# Patient Record
Sex: Male | Born: 1995 | Race: White | Hispanic: No | Marital: Single | Smoking: Never smoker
Health system: Southern US, Community
[De-identification: ages and names within clinical notes are randomized; demographics above are authoritative.]

## PROBLEM LIST (undated history)

## (undated) DIAGNOSIS — T7840XA Allergy, unspecified, initial encounter: Secondary | ICD-10-CM

## (undated) DIAGNOSIS — F419 Anxiety disorder, unspecified: Secondary | ICD-10-CM

## (undated) HISTORY — DX: Allergy, unspecified, initial encounter: T78.40XA

## (undated) HISTORY — PX: OTHER SURGICAL HISTORY: SHX169

## (undated) HISTORY — DX: Anxiety disorder, unspecified: F41.9

---

## 2015-09-05 ENCOUNTER — Ambulatory Visit: Payer: Self-pay | Admitting: Family Medicine

## 2015-09-06 ENCOUNTER — Encounter: Payer: Self-pay | Admitting: Family Medicine

## 2015-09-06 ENCOUNTER — Ambulatory Visit (INDEPENDENT_AMBULATORY_CARE_PROVIDER_SITE_OTHER): Payer: 59 | Admitting: Family Medicine

## 2015-09-06 VITALS — BP 124/78 | HR 80 | Temp 97.6°F | Resp 16 | Ht 72.0 in | Wt 167.0 lb

## 2015-09-06 DIAGNOSIS — Z23 Encounter for immunization: Secondary | ICD-10-CM | POA: Diagnosis not present

## 2015-09-06 DIAGNOSIS — N433 Hydrocele, unspecified: Secondary | ICD-10-CM

## 2015-09-06 DIAGNOSIS — F418 Other specified anxiety disorders: Secondary | ICD-10-CM

## 2015-09-06 NOTE — Progress Notes (Signed)
   Subjective:    Patient ID: Steven Guerra, male    DOB: [birth year], 19 y.o.   MRN: [id number]  HPI: Steven Guerra is a 19 y.o. male presenting on 09/06/2015 for Cyst   HPI  Pt presents for lump in R scrotum. Noticed on Saturday. Unsure if it was sore- thought it was sore but was anxious about it. Knot has gotten smaller since that day. No urinary symptoms. No pain currently. No change in color of testicle. No swelling.    Past Medical History  Diagnosis Date  . Anxiety   . Allergy     No current outpatient prescriptions on file prior to visit.   No current facility-administered medications on file prior to visit.    Review of Systems  Constitutional: Negative for fever and chills.  Respiratory: Negative for chest tightness and shortness of breath.   Cardiovascular: Negative for chest pain, palpitations and leg swelling.  Gastrointestinal: Negative for nausea and vomiting.  Genitourinary: Negative for dysuria, urgency, frequency, flank pain, decreased urine volume, difficulty urinating, genital sores and testicular pain.   Per HPI unless specifically indicated above     Objective:    BP 124/78 mmHg  Pulse 80  Temp(Src) 97.6 F (36.4 C) (Oral)  Resp 16  Ht 6' (1.829 m)  Wt 167 lb (75.751 kg)  BMI 22.64 kg/m2  Wt Readings from Last 3 Encounters:  09/06/15 167 lb (75.751 kg)  08/29/14 164 lb (74.39 kg) (66 %*, Z = 0.42)   * Growth percentiles are based on CDC 2-20 Years data.    Physical Exam  Constitutional: He appears well-developed and well-nourished. No distress.  Cardiovascular: Normal rate and regular rhythm.  Exam reveals no gallop and no friction rub.   No murmur heard. Pulmonary/Chest: Effort normal and breath sounds normal. No respiratory distress. He has no wheezes. He exhibits no tenderness.  Genitourinary: Penis normal. Right testis shows mass (Pea sized lump located in R upper scrotum above the epididymus.). Right testis shows no swelling.  Cremasteric reflex is not absent on the right side. Left testis shows no mass and no tenderness. Cremasteric reflex is not absent on the left side. Circumcised. No penile erythema or penile tenderness. No discharge found.  Skin: Skin is warm and dry. He is not diaphoretic.   No results found for this or any previous visit.    Assessment & Plan:   Problem List Items Addressed This Visit    None    Visit Diagnoses    Hydrocele, unspecified hydrocele type    -  Primary    Likely a hydrocele. US to confirm and R/o any other causes. US scheduled for Tuesday. Alarm symptoms reviewed with patient.     Relevant Orders    US Scrotum    US Art/Ven Flow Abd Pelv Doppler    Need for influenza vaccination        Relevant Orders    Flu Vaccine QUAD 36+ mos PF IM (Fluarix & Fluzone Quad PF) (Completed)       No orders of the defined types were placed in this encounter.      Follow up plan: Return if symptoms worsen or fail to improve.

## 2015-09-06 NOTE — Patient Instructions (Signed)
Hydrocele, Adult A hydrocele is a collection of fluid in the loose pouch of skin that holds the testicles (scrotum). Usually, it affects only one testicle. CAUSES This condition may be caused by:  An injury to the scrotum.  An infection.  A tumor or cancer of the testicle.  Twisting of a testicle.  Decreased blood flow to the scrotum. SYMPTOMS A hydrocele feels like a water-filled balloon. It may also feel heavy. A hydrocele can cause:  Swelling of the scrotum. The swelling may decrease when you lie down.  Swelling of the groin.  Mild discomfort in the scrotum.  Pain. This can develop if the hydrocele was caused by infection or twisting. DIAGNOSIS This condition may be diagnosed with a medical history, physical exam, and imaging tests. You may also have blood and urine tests to check for infection. TREATMENT Treatment may include:  Watching and waiting, particularly if the hydrocele causes no symptoms.  Treatment of the underlying condition. This may include using antibiotic medicine.  Surgery to drain the fluid. Some surgical options include:  Needle aspiration. For this procedure, a needle is used to drain fluid.  Hydrocelectomy. For this procedure, an incision is made in the scrotum to remove the fluid sac. HOME CARE INSTRUCTIONS  Keep all follow-up visits as told by your health care provider. This is important.  Watch the hydrocele for any changes.  Take over-the-counter and prescription medicines only as told by your health care provider.  If you were prescribed an antibiotic medicine, use it as told by your health care provider. Do not stop using the antibiotic even if your condition improves. SEEK MEDICAL CARE IF:  The swelling in your scrotum or groin gets worse.  The hydrocele becomes red, firm, tender to the touch, or painful.  You notice any changes in the hydrocele.  You have a fever.   This information is not intended to replace advice given to  you by your health care provider. Make sure you discuss any questions you have with your health care provider.   Document Released: 02/12/2010 Document Revised: 01/09/2015 Document Reviewed: 08/21/2014 Elsevier Interactive Patient Education 2016 [employer] Hydrocele, Adult A hydrocele is a collection of fluid in the loose pouch of skin that holds the testicles (scrotum). Usually, it affects only one testicle. CAUSES This condition may be caused by:  An injury to the scrotum.  An infection.  A tumor or cancer of the testicle.  Twisting of a testicle.  Decreased blood flow to the scrotum. SYMPTOMS A hydrocele feels like a water-filled balloon. It may also feel heavy. A hydrocele can cause:  Swelling of the scrotum. The swelling may decrease when you lie down.  Swelling of the groin.  Mild discomfort in the scrotum.  Pain. This can develop if the hydrocele was caused by infection or twisting. DIAGNOSIS This condition may be diagnosed with a medical history, physical exam, and imaging tests. You may also have blood and urine tests to check for infection. TREATMENT Treatment may include:  Watching and waiting, particularly if the hydrocele causes no symptoms.  Treatment of the underlying condition. This may include using antibiotic medicine.  Surgery to drain the fluid. Some surgical options include:  Needle aspiration. For this procedure, a needle is used to drain fluid.  Hydrocelectomy. For this procedure, an incision is made in the scrotum to remove the fluid sac. HOME CARE INSTRUCTIONS  Keep all follow-up visits as told by your health care provider. This is important.  Watch the hydrocele  for any changes.  Take over-the-counter and prescription medicines only as told by your health care provider.  If you were prescribed an antibiotic medicine, use it as told by your health care provider. Do not stop using the antibiotic even if your condition improves. SEEK  MEDICAL CARE IF:  The swelling in your scrotum or groin gets worse.  The hydrocele becomes red, firm, tender to the touch, or painful.  You notice any changes in the hydrocele.  You have a fever.   This information is not intended to replace advice given to you by your health care provider. Make sure you discuss any questions you have with your health care provider.   Document Released: 02/12/2010 Document Revised: 01/09/2015 Document Reviewed: 08/21/2014 Elsevier Interactive Patient Education [employer]2016 Elsevier Inc[employer]

## 2015-09-11 ENCOUNTER — Telehealth: Payer: Self-pay | Admitting: Family Medicine

## 2015-09-11 ENCOUNTER — Ambulatory Visit
Admission: RE | Admit: 2015-09-11 | Discharge: 2015-09-11 | Disposition: A | Discharge status: Home / Self Care | Payer: 59 | Source: Ambulatory Visit | Attending: Family Medicine | Admitting: Family Medicine

## 2015-09-11 DIAGNOSIS — N503 Cyst of epididymis: Secondary | ICD-10-CM | POA: Insufficient documentation

## 2015-09-11 DIAGNOSIS — N433 Hydrocele, unspecified: Secondary | ICD-10-CM

## 2015-09-11 DIAGNOSIS — N509 Disorder of male genital organs, unspecified: Secondary | ICD-10-CM | POA: Diagnosis present

## 2015-09-11 NOTE — Telephone Encounter (Signed)
Called to review US results. Reviewed with patient and mother. All questions answered and verbalized understanding.

## 2017-10-15 IMAGING — US US SCROTUM
1 series · 14 of 25 positions shown · non-contrast
Comparison: None.

CLINICAL DATA: Right-sided lump. Patient currently not able to feel
this. No scrotal pain.

EXAM:
SCROTAL ULTRASOUND
DOPPLER ULTRASOUND OF THE TESTICLES
TECHNIQUE: Complete ultrasound examination of the testicles, epididymis, and
other scrotal structures was performed. Color and spectral Doppler
ultrasound were also utilized to evaluate blood flow to the
testicles.

[Series 1: us scrotum · 0.07mm/px · 14 of 51 slices shown]
[im 1/51]
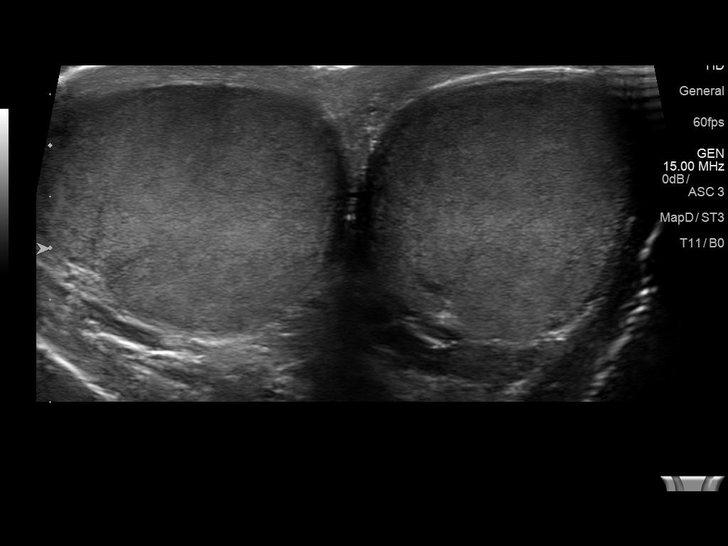
[im 5/51]
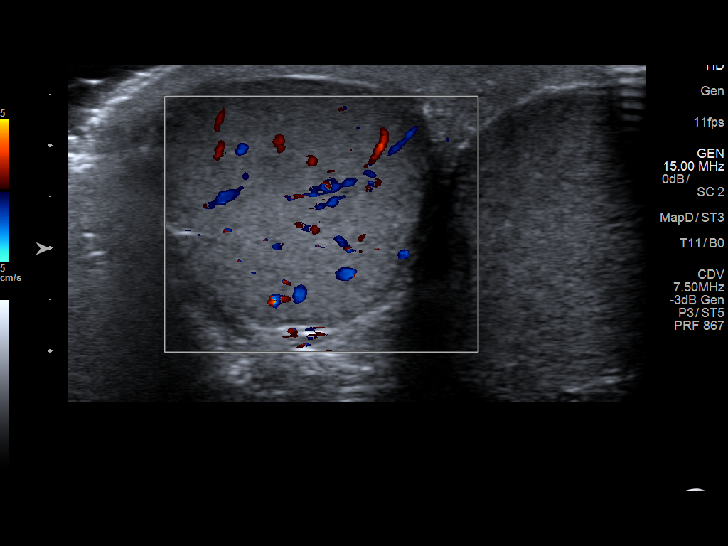
[im 9/51]
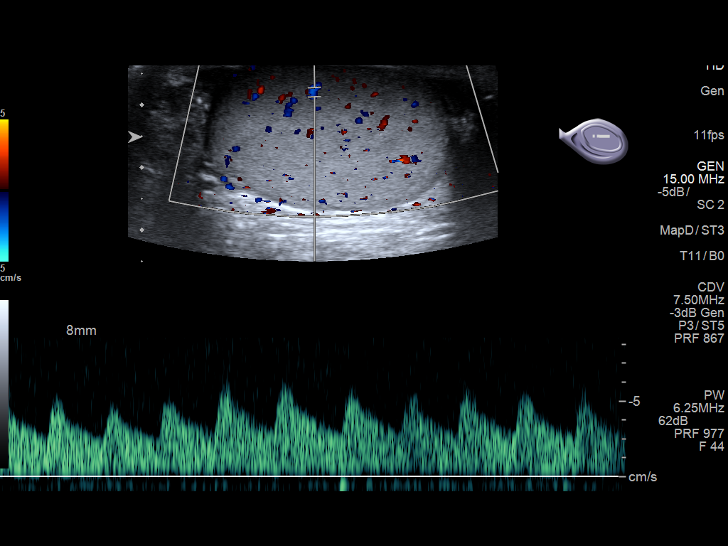
[im 13/51]
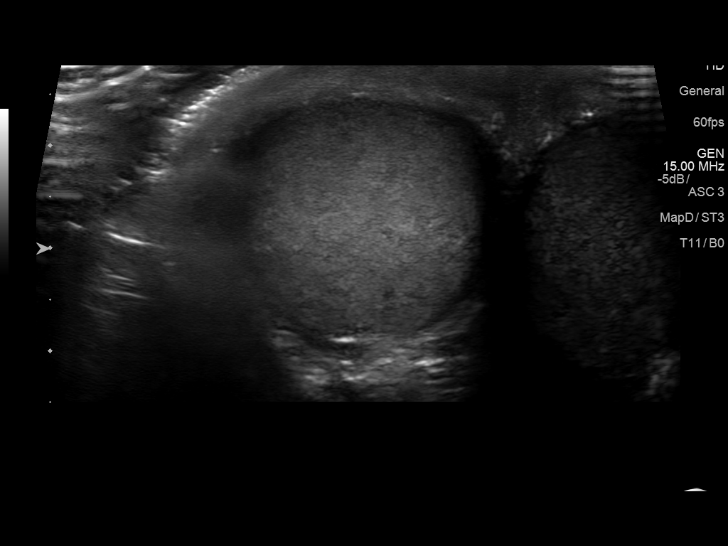
[im 17/51]
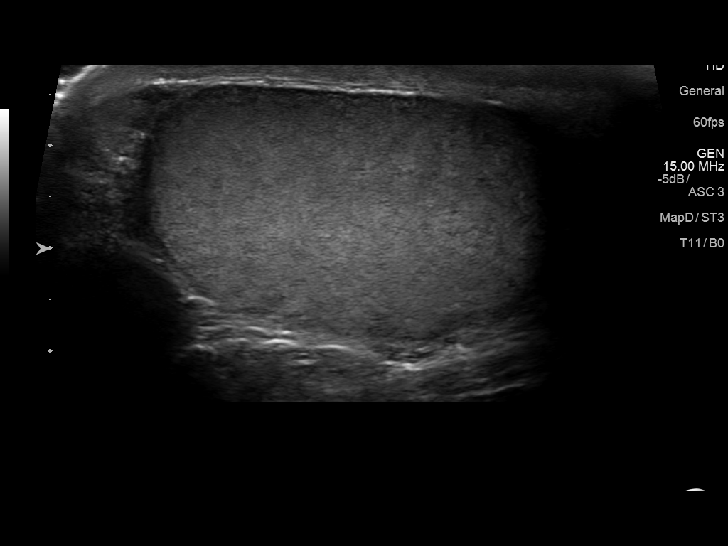
[im 19/51]
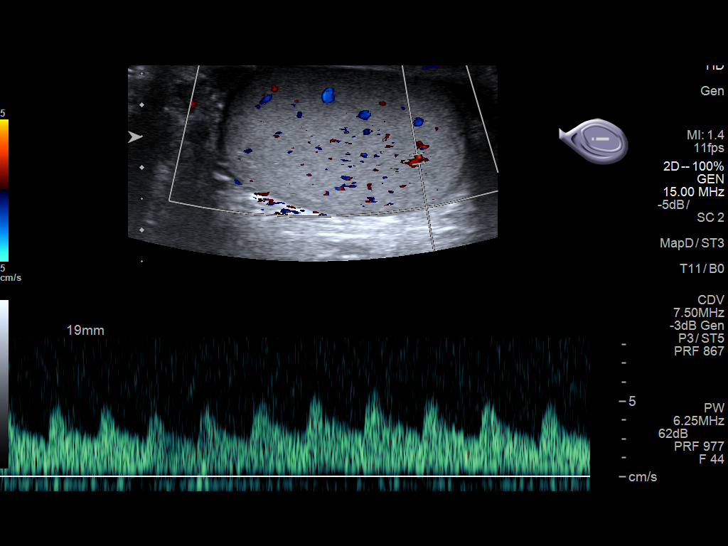
[im 23/51]
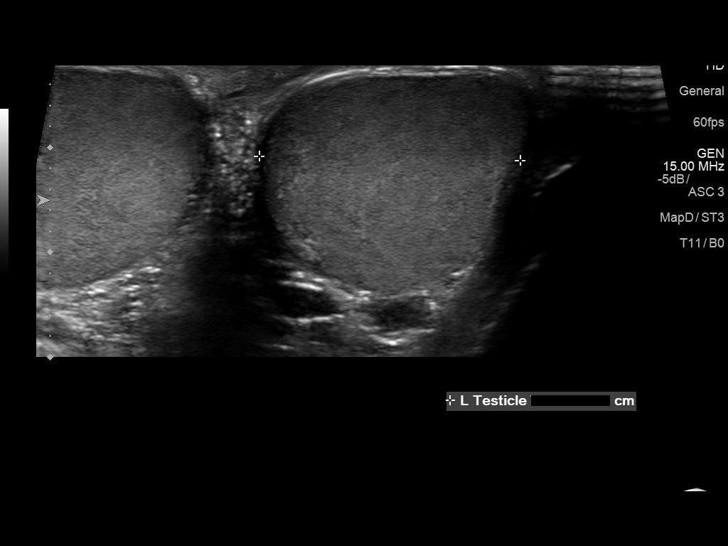
[im 28/51]
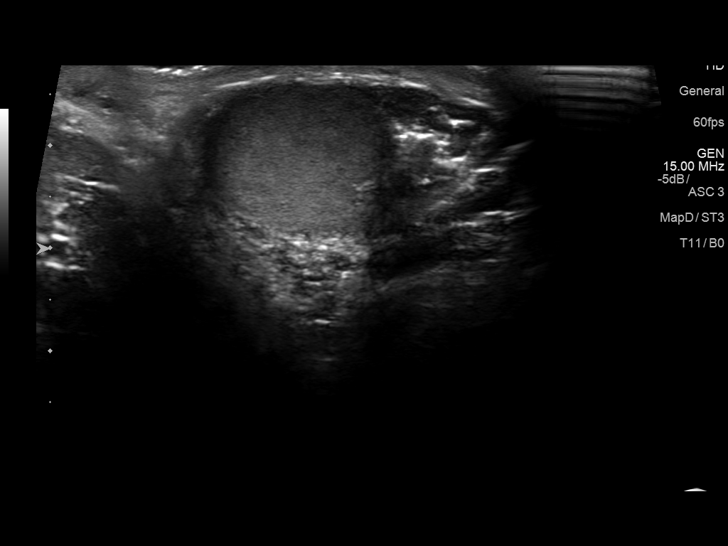
[im 32/51]
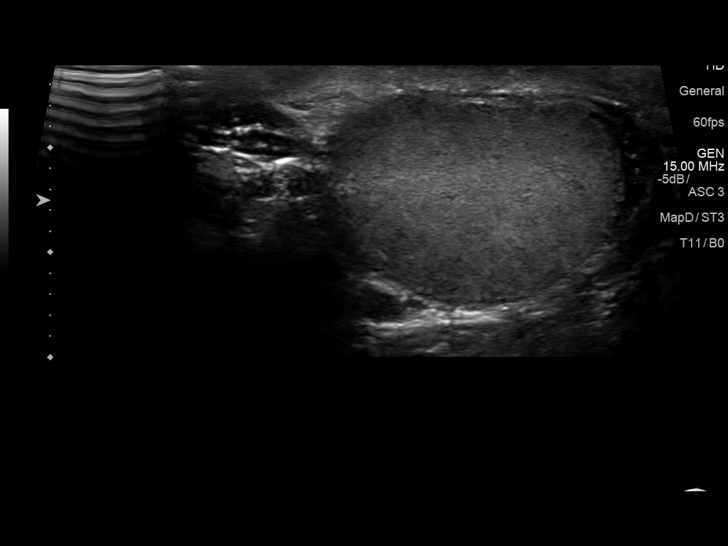
[im 34/51]
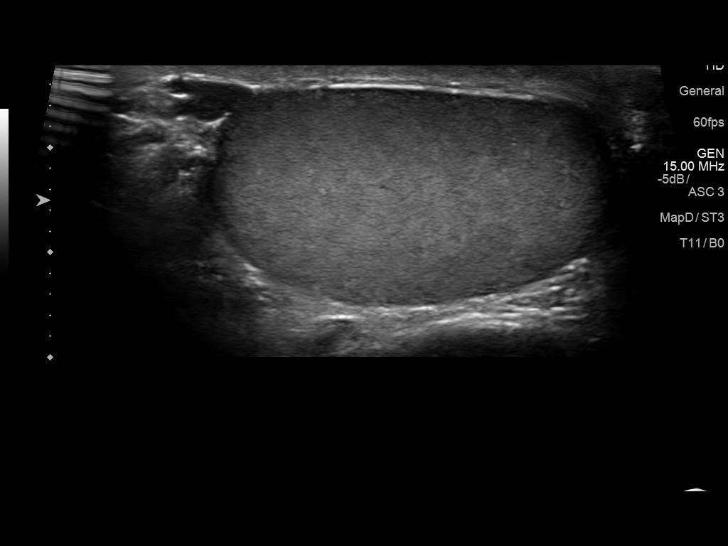
[im 38/51]
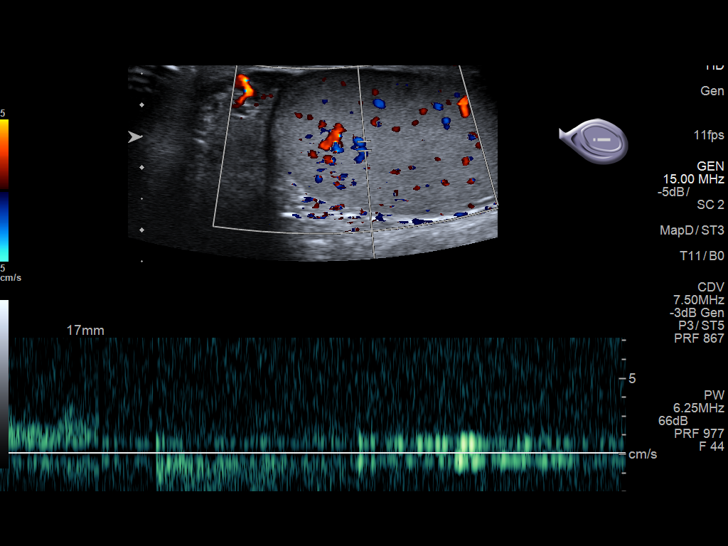
[im 42/51]
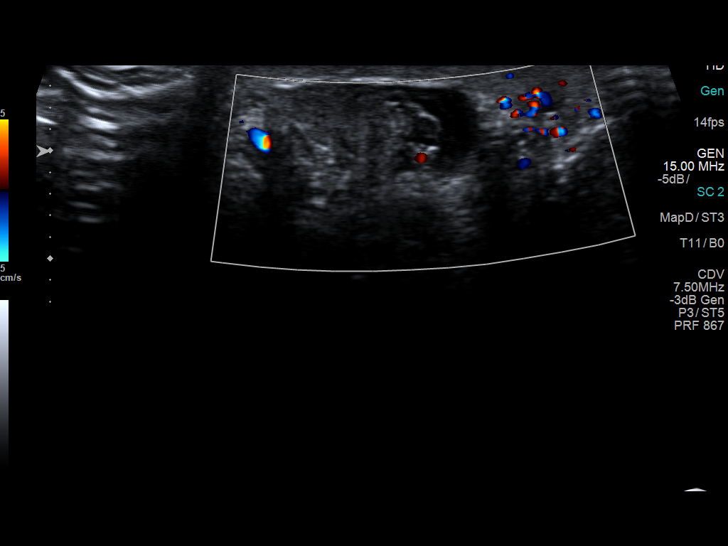
[im 46/51]
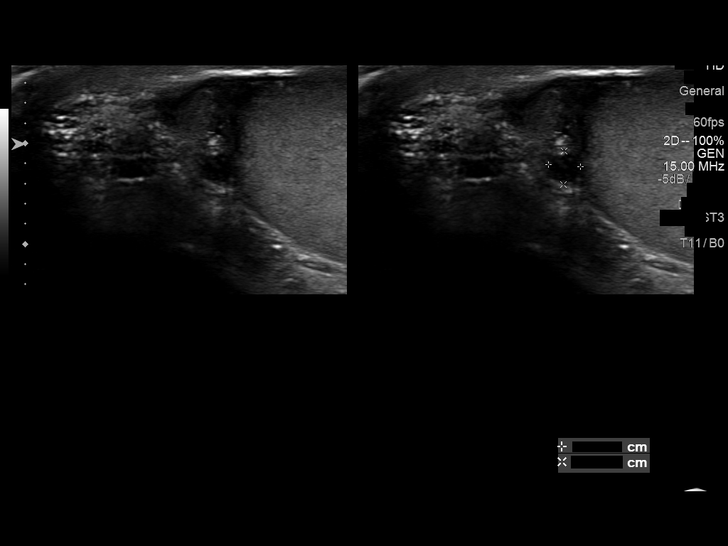
[im 51/51]
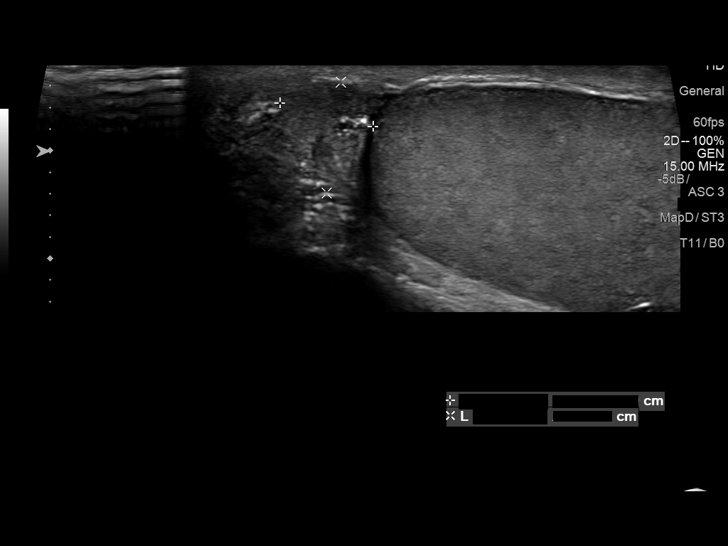

[14 of 25 positions shown; findings below may reference images not displayed]

FINDINGS: Right testicle

Measurements: 4.2 x 2.4 x 2.9 cm. No mass or microlithiasis
visualized.

Left testicle

Measurements: 4.3 x 2.2 x 2.5 cm. No mass or microlithiasis
visualized.

Right epididymis: 3 mm epididymal head cyst. Otherwise unremarkable.

Left epididymis:  Normal in size and appearance.

Hydrocele:  None visualized.

Varicocele:  None visualized.

Pulsed Doppler interrogation of both testes demonstrates normal low
resistance arterial and venous waveforms bilaterally.
IMPRESSION: 1. Small right epididymal head cyst measuring 3 mm. No other
abnormalities. No evidence of a testicular mass. No evidence of
testicular torsion or of epididymitis/ orchitis. No hydrocele or
varicocele.

## 2021-04-08 ENCOUNTER — Ambulatory Visit
Admission: EM | Admit: 2021-04-08 | Discharge: 2021-04-08 | Disposition: A | Discharge status: Home / Self Care | Payer: 59

## 2021-04-08 ENCOUNTER — Other Ambulatory Visit: Payer: Self-pay

## 2021-04-08 DIAGNOSIS — G510 Bell's palsy: Secondary | ICD-10-CM | POA: Diagnosis not present

## 2021-04-08 MED ORDER — VALACYCLOVIR HCL 1 G PO TABS: 1000.0000 mg | ORAL_TABLET | Freq: Three times a day (TID) | ORAL | 0 refills | Status: AC

## 2021-04-08 MED ORDER — PREDNISONE 10 MG PO TABS: 60.0000 mg | ORAL_TABLET | Freq: Every day | ORAL | 0 refills | Status: AC

## 2021-04-08 NOTE — ED Provider Notes (Addendum)
[facility]    CSN: [id number] Arrival date & time: 04/08/21  1547      History   Chief Complaint Chief Complaint  Patient presents with   Otalgia   Facial Droop    HPI Steven Guerra is a 25 y.o. male.   HPI  Facial Droop: Patient states that 2 days ago he noticed that he had a facial droop on the left-hand side.  No known injury.  He denies any other loss of strength of body.  He does have a left-sided ear pain but other than that he has been feeling well.  He is not having any rash, fever, headache, scalp tenderness, visual changes.  He has not tried anything for symptoms.  He denies any new vaccinations, illness, medications.  No known sick contacts.  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 Authorizing Provider  amphetamine-dextroamphetamine (ADDERALL) 20 MG tablet Take 20 mg by mouth 3 (three) times daily. 03/07/21  Yes [provider]  predniSONE (DELTASONE) 10 MG tablet Take 6 tablets (60 mg total) by mouth daily for 7 days. 04/08/21 04/15/21 Yes Kyair Ditommaso M, PA-C  valACYclovir (VALTREX) 1000 MG tablet Take 1 tablet (1,000 mg total) by mouth 3 (three) times daily for 7 days. 04/08/21 04/15/21 Yes CovingtonBrand Males, PA-C    Family History Family History  Problem Relation Age of Onset   Hypertension Mother     Social History Social History   Tobacco Use   Smoking status: Never   Smokeless tobacco: Never  Substance Use Topics   Alcohol use: Yes    Alcohol/week: 0.0 standard drinks    Comment: occasional   Drug use: No     Allergies   Dust mite mixed allergen ext [mite (d. farinae)]   Review of Systems Review of Systems  As stated  above in HPI Physical Exam Triage Vital Signs ED Triage Vitals  Enc Vitals Group     BP --      Pulse Rate 04/08/21 1605 70     Resp 04/08/21 1605 18     Temp 04/08/21 1605 98.1 F (36.7 C)     Temp src --      SpO2 04/08/21 1605 (!) 2 %     Weight --      Height --      Head Circumference --      Peak Flow --      Pain Score 04/08/21 1603 4     Pain Loc --      Pain Edu? --      Excl. in GC? --    No data found.  Updated Vital Signs Pulse 70   Temp 98.1 F (36.7 C)   Resp 18   SpO2 98%   Physical Exam Vitals and nursing note reviewed.  Constitutional:      General: He is not in acute distress.    Appearance: Normal appearance. He is not ill-appearing, toxic-appearing or diaphoretic.  HENT:     Head: Normocephalic and atraumatic.     Right Ear: Tympanic membrane normal. There is no impacted cerumen.  Left Ear: Tympanic membrane normal. There is no impacted cerumen.     Nose: Nose normal.     Mouth/Throat:     Mouth: Mucous membranes are moist.  Eyes:     Extraocular Movements: Extraocular movements intact.     Pupils: Pupils are equal, round, and reactive to light.     Comments: He is able to close both eyes with moderate effort  Cardiovascular:     Rate and Rhythm: Normal rate and regular rhythm.     Pulses: Normal pulses.     Heart sounds: Normal heart sounds.  Pulmonary:     Effort: Pulmonary effort is normal.     Breath sounds: Normal breath sounds.  Musculoskeletal:     Cervical back: Normal range of motion and neck supple.  Lymphadenopathy:     Cervical: No cervical adenopathy.  Skin:    General: Skin is warm.     Findings: No rash.     Comments: No tenderness of the scalp  Neurological:     Mental Status: He is alert.     Sensory: No sensory deficit.     Coordination: Coordination normal.     Gait: Gait normal.     Deep Tendon Reflexes: Reflexes normal.     Comments: Significant weakness of the left forehead resulting in lack of any  movement.  Moderate to severe weakness of the left mouth resulting in significantly reduced movement.  No weakness of extremities.  Psychiatric:        Mood and Affect: Mood normal.        Behavior: Behavior normal.        Thought Content: Thought content normal.        Judgment: Judgment normal.     UC Treatments / Results  Labs (all labs ordered are listed, but only abnormal results are displayed) Labs Reviewed - No data to display  EKG   Radiology No results found.  Procedures Procedures (including critical care time)  Medications Ordered in UC Medications - No data to display  Initial Impression / Assessment and Plan / UC Course  I have reviewed the triage vital signs and the nursing notes.  Pertinent labs & imaging results that were available during my care of the patient were reviewed by me and considered in my medical decision making (see chart for details).     New.  Discussed Bell's palsy and typical timeframe for resolution of symptoms.  He does not have a rash to indicate Ramsay Hunt syndrome.  House-Brackmann score of 4.  Treating with high-dose prednisone as well as valacyclovir per up-to-date recommendations to reduce complications and improve outcomes.  Discussed with patient how to take these medications along with common potential side effects and precautions.  We discussed the importance of eye care during this time.  See discharge instructions.  Discussed red flag signs and symptoms. Final Clinical Impressions(s) / UC Diagnoses   Final diagnoses:  Bell's palsy     Discharge Instructions      Use over-the-counter lubricated gel eyedrops every 3-4 hours Use over-the-counter lubricated ointment for the eye before bed and if the eye does not stay fully closed shut I would recommend using a waterproof sports tape to tape the eyelid shut during the night.  Please follow-up with ear nose and throat specialist as well as eye specialist   ED Prescriptions      Medication Sig Dispense Auth. Provider   predniSONE (DELTASONE) 10 MG tablet Take 6 tablets (60 mg total)  by mouth daily for 7 days. 42 tablet Homer Miller M, [place]   valACYclovir (VALTREX) 1000 MG tablet Take 1 tablet (1,000 mg total) by mouth 3 (three) times daily for 7 days. 21 tablet Rushie Chestnut, [place]      PDMP not reviewed this encounter.   Rushie Chestnut, PA-C 04/08/21 1646    Rushie Chestnut, PA-C 04/08/21 [phone]

## 2021-04-08 NOTE — ED Triage Notes (Signed)
Pt presents with c/o left ear pain  that began week ago then began to have left side facial paralysis 2 days ago

## 2021-04-08 NOTE — Discharge Instructions (Addendum)
Use over-the-counter lubricated gel eyedrops every 3-4 hours Use over-the-counter lubricated ointment for the eye before bed and if the eye does not stay fully closed shut I would recommend using a waterproof sports tape to tape the eyelid shut during the night.  Please follow-up with ear nose and throat specialist as well as eye specialist
